# Patient Record
Sex: Female | Born: 1995 | Race: White | Hispanic: No | Marital: Single
Health system: Midwestern US, Community
[De-identification: ages and names within clinical notes are randomized; demographics above are authoritative.]

## PROBLEM LIST (undated history)

## (undated) DIAGNOSIS — F410 Panic disorder [episodic paroxysmal anxiety] without agoraphobia: Secondary | ICD-10-CM

## (undated) DIAGNOSIS — F419 Anxiety disorder, unspecified: Secondary | ICD-10-CM

---
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
## 2016-01-05 ENCOUNTER — Emergency Department: Payer: BLUE CROSS/BLUE SHIELD

## 2016-01-05 ENCOUNTER — Emergency Department
Admission: EM | Admit: 2016-01-05 | Discharge: 2016-01-05 | Disposition: A | Discharge status: Home / Self Care | Payer: BLUE CROSS/BLUE SHIELD | Attending: Emergency Medicine | Admitting: Emergency Medicine

## 2016-01-05 ENCOUNTER — Encounter: Payer: Self-pay | Admitting: *Deleted

## 2016-01-05 DIAGNOSIS — R079 Chest pain, unspecified: Secondary | ICD-10-CM | POA: Diagnosis present

## 2016-01-05 DIAGNOSIS — F410 Panic disorder [episodic paroxysmal anxiety] without agoraphobia: Secondary | ICD-10-CM | POA: Diagnosis not present

## 2016-01-05 MED ORDER — LORAZEPAM 0.5 MG PO TABS: 0.5000 mg | ORAL_TABLET | Freq: Three times a day (TID) | ORAL | Status: AC | PRN

## 2016-01-05 MED ORDER — DIAZEPAM 5 MG PO TABS
5.0000 mg | ORAL_TABLET | Freq: Once | ORAL | Status: DC
  Filled 2016-01-05: qty 1

## 2016-01-05 NOTE — Discharge Instructions (Signed)

## 2016-01-05 NOTE — ED Notes (Signed)
NAD noted at time of D/C. Pt denies questions or concerns. Pt ambulatory to the lobby at this time.  

## 2016-01-05 NOTE — ED Notes (Signed)
Patient transported to X-ray 

## 2016-01-05 NOTE — ED Notes (Signed)
Pt reports chest pain and sob for 6 weeks.  Pt reports starting bcp's 3 months ago.  Reports nausea for 1 week.  Pt tearful and is refusing blood work at this time.  Nonsmoker.  No cough.  No fever.

## 2016-01-05 NOTE — ED Provider Notes (Signed)
[facility] Emergency Department Provider Note     Time seen: ----------------------------------------- 4:10 PM on 01/05/2016 -----------------------------------------    I have reviewed the triage vital signs and the nursing notes.   HISTORY  Chief Complaint Chest Pain    HPI Gina Rivera is a 20 y.o. female who presents to ER with chest pain shortness of breath for last 6 weeks has been intermittent. Patient reports taking birth control 3 months ago. She's been nauseous, patient was tearful and hyperventilating on arrival here. Patient states she felt lightheaded at work and her leg starting hurting her, she then had to sit down at work. Patient is still having chest pain in the midsternal area.   Past Medical History  Diagnosis Date  . Anxiety     There are no active problems to display for this patient.   No past surgical history on file.  Allergies Review of patient's allergies indicates no known allergies.  Social History Social History  Substance Use Topics  . Smoking status: Never Smoker   . Smokeless tobacco: None  . Alcohol Use: No    Review of Systems Constitutional: Negative for fever. Eyes: Negative for visual changes. ENT: Negative for sore throat. Cardiovascular: Positive for chest pain Respiratory: Positive for shortness of breath Gastrointestinal: Negative for abdominal pain, vomiting and diarrhea. Genitourinary: Negative for dysuria. Musculoskeletal: Negative for back pain. Skin: Negative for rash. Neurological: Negative for headaches, positive for weakness  10-point ROS otherwise negative.  ____________________________________________   PHYSICAL EXAM:  VITAL SIGNS: ED Triage Vitals  Enc Vitals Group     BP 01/05/16 1558 145/77 mmHg     Pulse Rate 01/05/16 1558 96     Resp 01/05/16 1558 20     Temp 01/05/16 1558 98.6 F (37 C)     Temp Source 01/05/16 1558 Oral     SpO2 01/05/16 1558 99 %     Weight  01/05/16 1558 175 lb (79.379 kg)     Height 01/05/16 1558  (1.651 m)     Head Cir --      Peak Flow --      Pain Score 01/05/16 1559 4     Pain Loc --      Pain Edu? --      Excl. in GC? --     Constitutional: Alert and oriented. Patient is hyperventilating Eyes: Conjunctivae are normal. PERRL. Normal extraocular movements. ENT   Head: Normocephalic and atraumatic.   Nose: No congestion/rhinnorhea.   Mouth/Throat: Mucous membranes are moist.   Neck: No stridor. Cardiovascular: Rapid rate, regular rhythm. Normal and symmetric distal pulses are present in all extremities. No murmurs, rubs, or gallops. Respiratory: Tachypnea with clear lungs bilaterally. Gastrointestinal: Soft and nontender. No distention. No abdominal bruits.  Musculoskeletal: Nontender with normal range of motion in all extremities. No joint effusions.  No lower extremity tenderness nor edema. Neurologic:  Normal speech and language. No gross focal neurologic deficits are appreciated. Speech is normal. No gait instability. Skin:  Skin is warm, dry and intact. No rash noted. Psychiatric: Anxious mood and affect ____________________________________________  EKG: Interpreted by me. Sinus tachycardia with a rate of 105 bpm, normal PR interval, normal QRS, normal QT interval. Normal axis.  ____________________________________________  ED COURSE:  Pertinent labs & imaging results that were available during my care of the patient were reviewed by me and considered in my medical decision making (see chart for details). Patient is no acute distress, appears to be having a panic  attack. I will offer oral anxiolytics.   IMPRESSION: No active cardiopulmonary disease. ____________________________________________  FINAL ASSESSMENT AND PLAN  Panic attack  Plan: Patient with imaging as dictated above. Patient clinically with a panic attack, EKG and chest x-ray are unremarkable. Will prescribe Ativan she can  take as needed on an outpatient basis.   Emily Filbert, MD   Emily Filbert, MD 01/05/16 [phone]

## 2016-03-28 IMAGING — CR DG CHEST 2V
1 series · 2 of 2 positions shown · non-contrast
Comparison: 12/03/2015

CLINICAL DATA: Chest pain and shortness of breath for 6 weeks.
Nausea for 1 week.

EXAM:
CHEST  2 VIEW

[Series 1: dg chest 2 view · 0.14mm/px · 2 of 2 slices shown]
[im 1/2]
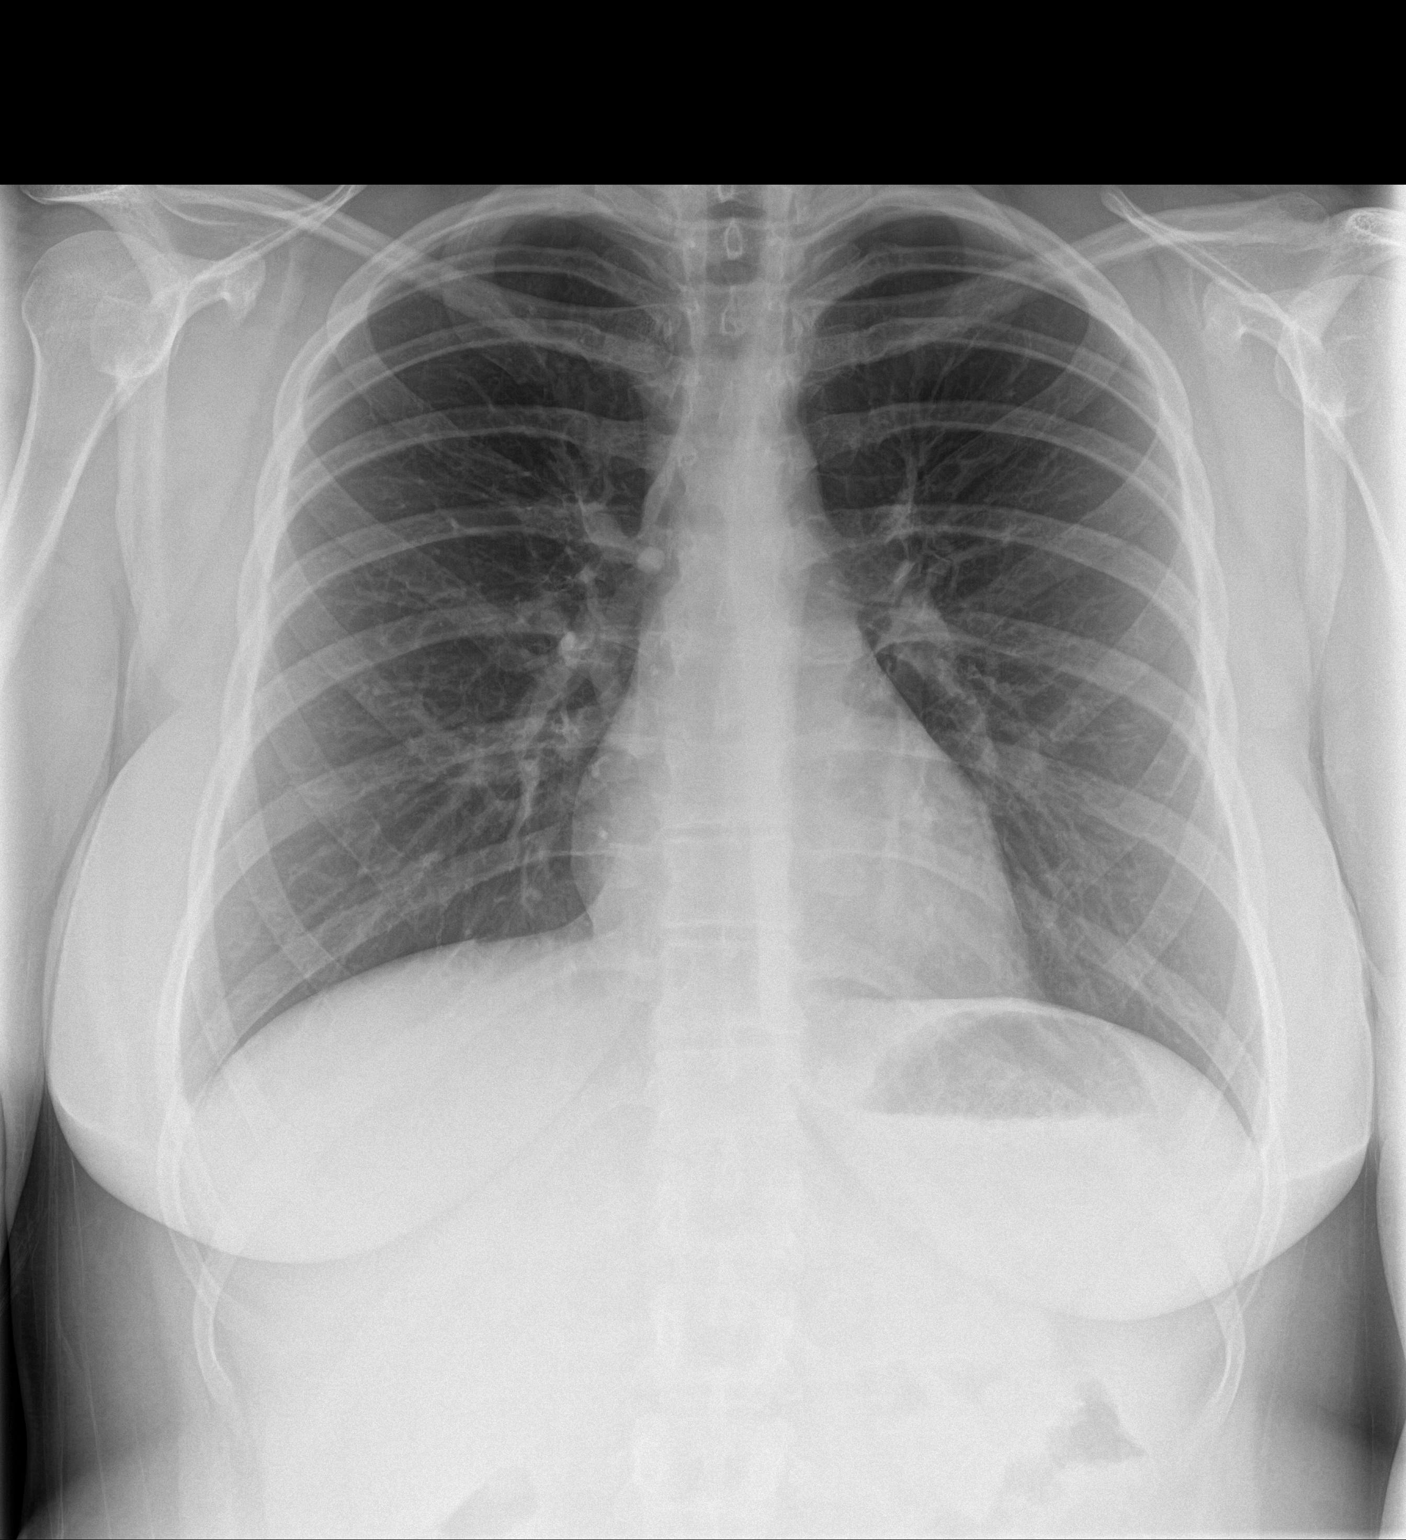
[im 2/2]
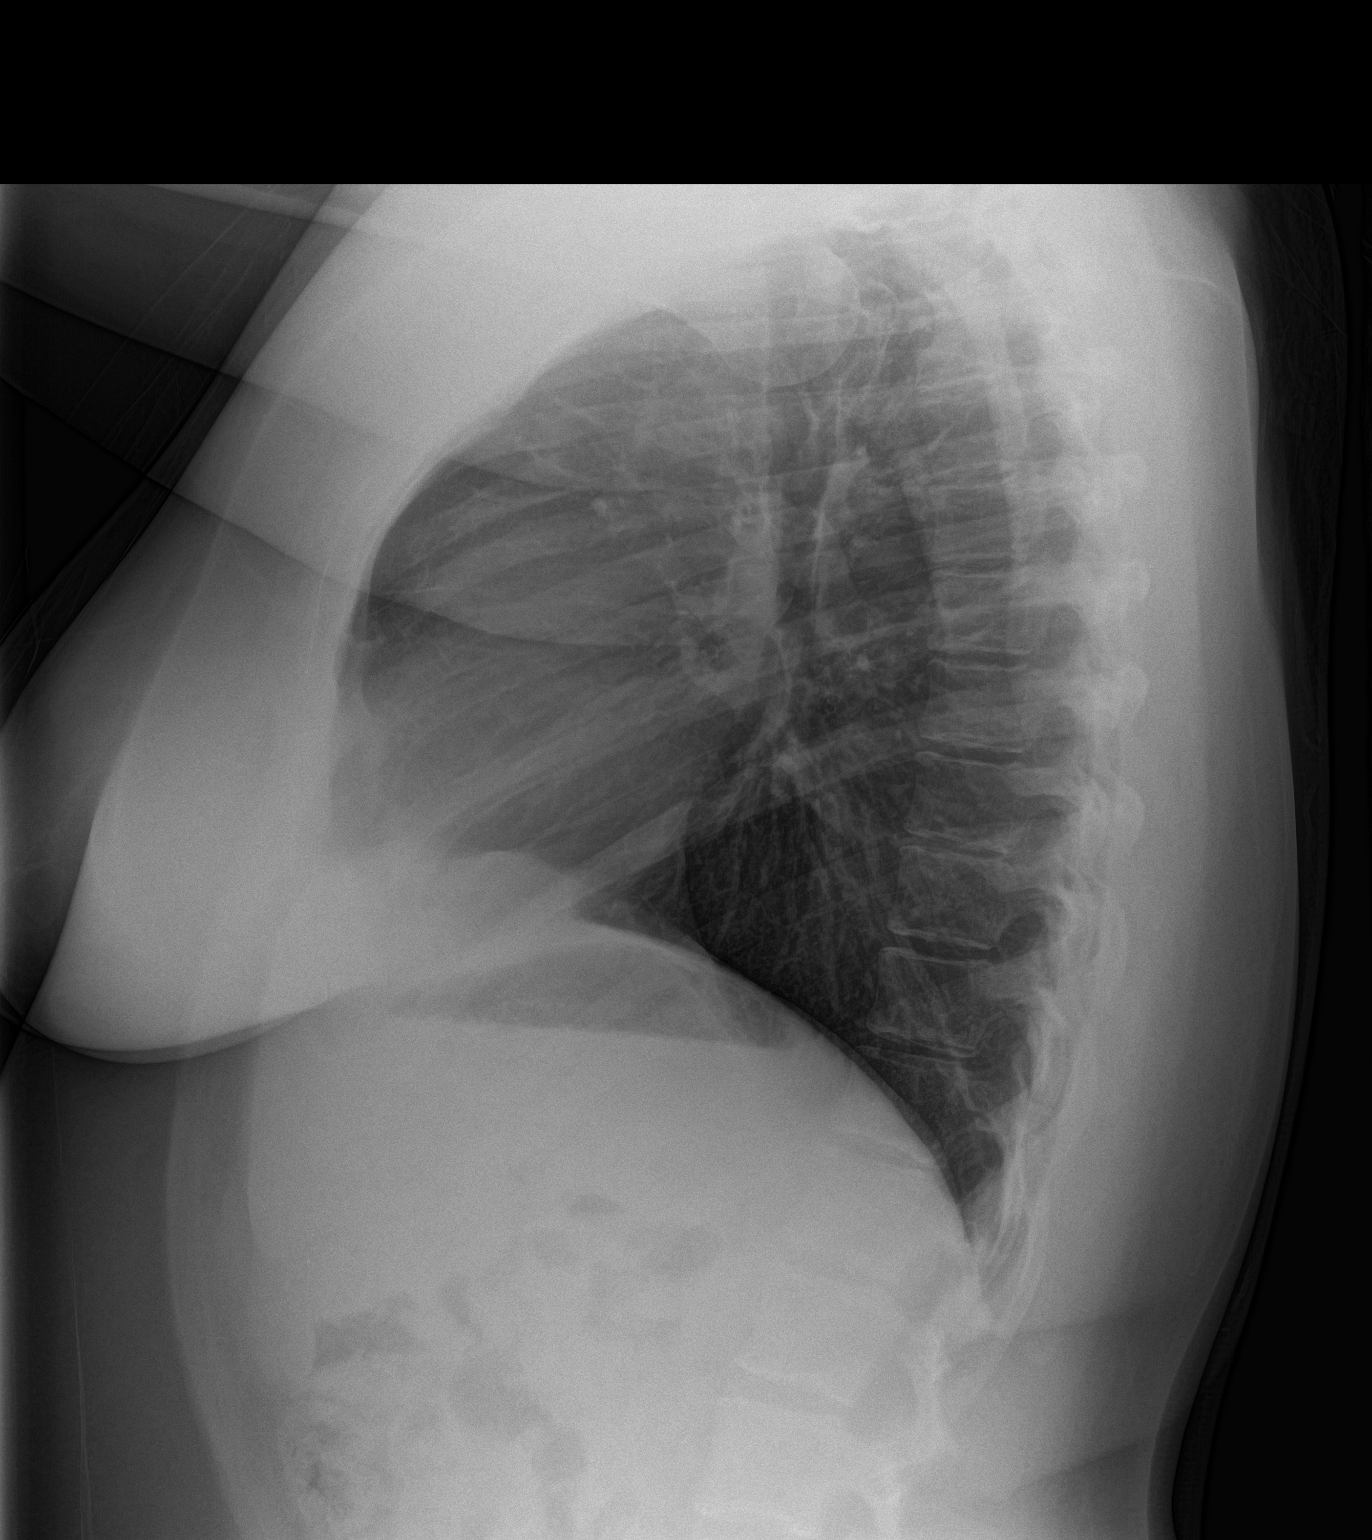

[2 of 2 positions shown; findings below may reference images not displayed]

FINDINGS: The heart size and mediastinal contours are within normal limits.
Both lungs are clear. The visualized skeletal structures are
unremarkable.
IMPRESSION: No active cardiopulmonary disease.

## 2017-11-15 ENCOUNTER — Inpatient Hospital Stay: Admit: 2017-11-15 | Discharge: 2017-11-15 | Disposition: A | Discharge status: Home / Self Care

## 2017-11-15 DIAGNOSIS — R300 Dysuria: Secondary | ICD-10-CM

## 2017-11-15 LAB — UA W/O MICROSCOPIC, REFLEX C & S
Bilirubin Urine: NEGATIVE
Glucose, Urine: NEGATIVE mg/dl
Ketones, Urine: NEGATIVE
Nitrate, UA: NEGATIVE
Protein, UA: 100 mg/dl — AB
Specific Gravity, UA: 1.025 (ref 1.002–1.03)
Urobilinogen, Urine: 0.2 eu/dl (ref 0.0–1.0)
pH, UA: 7.5 (ref 5.0–9.0)

## 2017-11-15 MED ORDER — SULFAMETHOXAZOLE-TRIMETHOPRIM 800-160 MG PO TABS
800-160 MG | ORAL_TABLET | Freq: Two times a day (BID) | ORAL | 0 refills | Status: AC
Start: 2017-11-15 — End: 2017-11-25

## 2017-11-15 MED ORDER — FLUCONAZOLE 150 MG PO TABS
150 MG | ORAL_TABLET | Freq: Once | ORAL | 0 refills | Status: AC
Start: 2017-11-15 — End: 2017-11-15

## 2017-11-15 NOTE — ED Triage Notes (Signed)
To room with c/o hematuria, dysuria, and frequency that started yesterday.

## 2017-11-15 NOTE — ED Provider Notes (Signed)
[facility]  Urgent Care Encounter       CHIEF COMPLAINT       Chief Complaint   Patient presents with   . Dysuria       Nurses Notes reviewed and I agree except as noted in the HPI.  HISTORY OF PRESENT ILLNESS   Nicole Shaffer is a 22 y.o. female who presents     Patient states that since yesterday she has had some intermittent flank pain that started on left side, but has since resolved.  Patient states that she is urinating more frequently, and having burning sensation with urinating.  She reports that she currently takes oral contraceptives, as directed, last known period being that a week ago.        Dysuria    This is a new problem. The current episode started 2 days ago. The problem occurs every urination. The problem has not changed since onset.The quality of the pain is described as burning and aching. The pain is at a severity of 4/10. The pain is mild. There has been no fever. She is not sexually active. Associated symptoms include hesitancy, urgency and flank pain (intermittent, left side). Pertinent negatives include no chills, no sweats, no nausea, no vomiting, no discharge, no frequency, no hematuria and no possible pregnancy. She has tried nothing for the symptoms. Her past medical history is significant for recurrent UTIs. Her past medical history does not include kidney stones, single kidney, urological procedure, urinary stasis or catheterization.       REVIEW OF SYSTEMS     Review of Systems   Constitutional: Negative for chills.   Gastrointestinal: Negative for abdominal pain, nausea and vomiting.   Genitourinary: Positive for dysuria, flank pain (intermittent, left side), hesitancy and urgency. Negative for decreased urine volume, difficulty urinating, dyspareunia, enuresis, frequency, genital sores, hematuria, menstrual problem, pelvic pain, vaginal bleeding, vaginal discharge and vaginal pain.       PAST MEDICAL HISTORY   History reviewed. No pertinent past medical  history.    SURGICALHISTORY     Patient  has no past surgical history on file.    CURRENT MEDICATIONS       Discharge Medication List as of 11/15/2017  1:53 PM      CONTINUE these medications which have NOT CHANGED    Details   norgestimate-ethinyl estradiol (SPRINTEC 28) 0.25-35 MG-MCG per tablet Take 1 tablet by mouth dailyHistorical Med             ALLERGIES     Patient is has No Known Allergies.    Patients   There is no immunization history on file for this patient.    FAMILY HISTORY     Patient's family history is not on file.    SOCIAL HISTORY     Patient  reports that she has never smoked. She has never used smokeless tobacco. She reports that she does not drink alcohol or use drugs.    PHYSICAL EXAM     ED TRIAGE VITALS  BP: 139/77, Temp: 98.7 F (37.1 C), Pulse: 95, Resp: 16, SpO2: 98 %,Estimated body mass index is 33.47 kg/m as calculated from the following:    Height as of this encounter: 5\' 4"  (1.626 m).    Weight as of this encounter: 195 lb (88.5 kg).,Patient's last menstrual period was 11/07/2017.    Physical Exam   Constitutional: She is oriented to person, place, and time. She appears well-developed and well-nourished. No distress.   Musculoskeletal:  Normal range of motion.   Neurological: She is alert and oriented to person, place, and time.   Skin: Skin is warm. Capillary refill takes less than 2 seconds. She is not diaphoretic.   Psychiatric: She has a normal mood and affect. Her behavior is normal. Judgment and thought content normal.   Nursing note and vitals reviewed.      DIAGNOSTIC RESULTS     Labs:  Results for orders placed or performed during the hospital encounter of 11/15/17   UA without Microscopic Reflex C&S   Result Value Ref Range    Glucose, Urine Negative NEGATIVE mg/dl    Bilirubin Urine Negative NEGATIVE    Ketones, Urine Negative NEGATIVE    Specific Gravity, UA 1.025 1.002 - 1.03    Blood, Urine Large (A) NEGATIVE    pH, UA 7.50 5.0 - 9.0    Protein, UA 100 (A) NEGATIVE mg/dl     Urobilinogen, Urine 0.20 0.0 - 1.0 eu/dl    Nitrate, UA Negative NEGATIVE    LEUKOCYTES, UA Small (A) NEGATIVE    Color, UA Amber (A) STRAW-YELL    Character, Urine Slightly Cloudy CLEAR-SL C    REFLEX TO URINE C & S NOT INDICATED        IMAGING:    No orders to display     URGENT CARE COURSE:     Vitals:    11/15/17 1321   BP: 139/77   Pulse: 95   Resp: 16   Temp: 98.7 F (37.1 C)   TempSrc: Temporal   SpO2: 98%   Weight: 195 lb (88.5 kg)   Height: 5\' 4"  (1.626 m)       Medications - No data to display         PROCEDURES:  None    FINAL IMPRESSION      1. Dysuria          DISPOSITION/ PLAN   Patient Is discharged home with prescription for Bactrim and Diflucan.  Discussed with patient that UA results showed blood, as well as small amount of leukocytes.  Patient is instructed to follow-up with primary care provider if symptoms have not improved within the next 4-5 days.  Patient should follow up in ER setting if tenderness or pain to flank increases or does not improve within the next 2-3 days.      PATIENT REFERRED TO:  No primary care provider on file.  No primary physician on file.      DISCHARGE MEDICATIONS:  Discharge Medication List as of 11/15/2017  1:53 PM      START taking these medications    Details   sulfamethoxazole-trimethoprim (BACTRIM DS;SEPTRA DS) 800-160 MG per tablet Take 1 tablet by mouth 2 times daily for 10 days, Disp-20 tablet, R-0Print      fluconazole (DIFLUCAN) 150 MG tablet Take 1 tablet by mouth once for 1 dose, Disp-1 tablet, R-0Print             Discharge Medication List as of 11/15/2017  1:53 PM          Discharge Medication List as of 11/15/2017  1:53 PM          Lindajo Royal, APRN - NP    (Please note that portions of this note were completed with a voice recognition program. Efforts were made to edit the dictations but occasionally words are mis-transcribed.)         Lajean Silvius, APRN - NP  11/15/17 1531

## 2017-11-15 NOTE — Discharge Instructions (Signed)
Patient is encouraged to continue drinking plenty of fluids to help flush out any bacteria from urinary tract.  If symptoms do not improve within the next 3-4 days recommends patient follow up with primary care provider.  If flank tenderness worsens or has not improved within the next 1 to 2 days should follow up in ER setting for possible CT.

## 2020-03-31 ENCOUNTER — Other Ambulatory Visit: Payer: Self-pay

## 2020-03-31 ENCOUNTER — Ambulatory Visit (HOSPITAL_COMMUNITY)
Admission: EM | Admit: 2020-03-31 | Discharge: 2020-03-31 | Disposition: A | Discharge status: Home / Self Care | Payer: BC Managed Care – PPO | Attending: Urgent Care | Admitting: Urgent Care

## 2020-03-31 ENCOUNTER — Encounter (HOSPITAL_COMMUNITY): Payer: Self-pay

## 2020-03-31 DIAGNOSIS — Z20822 Contact with and (suspected) exposure to covid-19: Secondary | ICD-10-CM | POA: Insufficient documentation

## 2020-03-31 DIAGNOSIS — J029 Acute pharyngitis, unspecified: Secondary | ICD-10-CM | POA: Insufficient documentation

## 2020-03-31 DIAGNOSIS — J069 Acute upper respiratory infection, unspecified: Secondary | ICD-10-CM | POA: Diagnosis not present

## 2020-03-31 HISTORY — DX: Panic disorder (episodic paroxysmal anxiety): F41.0

## 2020-03-31 LAB — SARS CORONAVIRUS 2 (TAT 6-24 HRS): SARS Coronavirus 2: NEGATIVE

## 2020-03-31 LAB — POCT RAPID STREP A: Streptococcus, Group A Screen (Direct): NEGATIVE

## 2020-03-31 MED ORDER — IBUPROFEN 600 MG PO TABS: 600.0000 mg | ORAL_TABLET | Freq: Four times a day (QID) | ORAL | 0 refills | Status: AC | PRN

## 2020-03-31 MED ORDER — CEPACOL SORE THROAT 5.4 MG MT LOZG: 1.0000 | LOZENGE | OROMUCOSAL | 0 refills | Status: AC | PRN

## 2020-03-31 MED ORDER — BENZONATATE 100 MG PO CAPS: 100.0000 mg | ORAL_CAPSULE | Freq: Three times a day (TID) | ORAL | 0 refills | Status: AC

## 2020-03-31 NOTE — ED Provider Notes (Signed)
[facility]    CSN: [id number] Arrival date & time: 03/31/20  1032      History   Chief Complaint Chief Complaint  Patient presents with  . Sore Throat    HPI Gina Rivera is a 24 y.o. female.   Patient with history of anxiety and panic attacks presents for evaluation of sore throat.  She reports sore throat is been very bad for the last 2 days.  She reports it hurts to swallow both liquids and solids.  She denies difficulty swallowing and that is only the pain causing the issue.  Sensation of throat swelling.  She has been tolerating liquids and oral medicines however.  There is been no measured fever up to 100.4 at home.  She has been taking DayQuil and NyQuil.  This is helped only minorly.  She is also had some nasal congestion and a slight cough.  Her family members all had a recent virus.  Known was tested for Covid.  Symptoms were similar to this.  She has not had any other exposures or known strep exposures.  Denies upset stomach, nausea, vomiting, diarrhea.  No change in taste or smell.  No ear pain.  She does report she felt a little lightheaded last couple days in the morning.  Denies dizziness.  No chest pain or shortness of breath.     Past Medical History:  Diagnosis Date  . Anxiety   . Panic attacks     There are no problems to display for this patient.   History reviewed. No pertinent surgical history.  OB History    Gravida  0   Para  0   Term  0   Preterm  0   AB  0   Living  0     SAB  0   TAB  0   Ectopic  0   Multiple  0   Live Births               Home Medications    Prior to Admission medications   Medication Sig Start Date End Date Taking? Authorizing Provider  benzonatate (TESSALON) 100 MG capsule Take 1 capsule (100 mg total) by mouth every 8 (eight) hours. 03/31/20   Adolpho Meenach, Marguerita Beards, PA-C  ibuprofen (ADVIL) 600 MG tablet Take 1 tablet (600 mg total) by mouth every 6 (six) hours as needed. 03/31/20   Maiyah Goyne, Marguerita Beards,  PA-C  Menthol (CEPACOL SORE THROAT) 5.4 MG LOZG Use as directed 1 lozenge (5.4 mg total) in the mouth or throat every 2 (two) hours as needed. 03/31/20   Laurelin Elson, Marguerita Beards, PA-C  PRESCRIPTION MEDICATION Take 1 tablet by mouth daily. Birth Control    [provider]  SPRINTEC 28 0.25-35 MG-MCG tablet Take 1 tablet by mouth daily. 03/12/20   [provider]    Family History Family History  Problem Relation Age of Onset  . Healthy Mother   . Healthy Father     Social History Social History   Tobacco Use  . Smoking status: Never Smoker  Substance Use Topics  . Alcohol use: No  . Drug use: No     Allergies   Patient has no known allergies.   Review of Systems Review of Systems   Physical Exam Triage Vital Signs ED Triage Vitals  Enc Vitals Group     BP 03/31/20 1055 136/76     Pulse Rate 03/31/20 1055 (!) 106     Resp 03/31/20 1055 18  Temp 03/31/20 1055 99.1 F (37.3 C)     Temp Source 03/31/20 1055 Oral     SpO2 03/31/20 1055 99 %     Weight 03/31/20 1056 240 lb (108.9 kg)     Height 03/31/20 1056 5\' 3"  (1.6 m)     Head Circumference --      Peak Flow --      Pain Score 03/31/20 1055 8     Pain Loc --      Pain Edu? --      Excl. in GC? --    No data found.  Updated Vital Signs BP 136/76   Pulse (!) 106   Temp 99.1 F (37.3 C) (Oral)   Resp 18   Ht 5\' 3"  (1.6 m)   Wt 240 lb (108.9 kg)   SpO2 99%   BMI 42.51 kg/m   Visual Acuity Right Eye Distance:   Left Eye Distance:   Bilateral Distance:    Right Eye Near:   Left Eye Near:    Bilateral Near:     Physical Exam Vitals and nursing note reviewed.  Constitutional:      General: She is not in acute distress.    Appearance: She is well-developed. She is not ill-appearing.  HENT:     Head: Normocephalic and atraumatic.     Right Ear: Tympanic membrane and ear canal normal.     Left Ear: Tympanic membrane and ear canal normal.     Nose: Congestion and rhinorrhea present.      Mouth/Throat:     Mouth: Mucous membranes are moist.     Pharynx: Oropharynx is clear. Uvula midline. Posterior oropharyngeal erythema (mild) present. No pharyngeal swelling, oropharyngeal exudate or uvula swelling.     Tonsils: No tonsillar exudate or tonsillar abscesses. 1+ on the right. 1+ on the left.  Eyes:     Conjunctiva/sclera: Conjunctivae normal.  Cardiovascular:     Rate and Rhythm: Normal rate and regular rhythm.     Heart sounds: No murmur.  Pulmonary:     Effort: Pulmonary effort is normal. No respiratory distress.     Breath sounds: Normal breath sounds. No wheezing, rhonchi or rales.  Abdominal:     Palpations: Abdomen is soft.     Tenderness: There is no abdominal tenderness.  Musculoskeletal:     Cervical back: Normal range of motion and neck supple.  Lymphadenopathy:     Cervical: No cervical adenopathy.  Skin:    General: Skin is warm and dry.  Neurological:     Mental Status: She is alert.      UC Treatments / Results  Labs (all labs ordered are listed, but only abnormal results are displayed) Labs Reviewed  SARS CORONAVIRUS 2 (TAT 6-24 HRS)  CULTURE, GROUP A STREP [facility])  POCT RAPID STREP A    EKG   Radiology No results found.  Procedures Procedures (including critical care time)  Medications Ordered in UC Medications - No data to display  Initial Impression / Assessment and Plan / UC Course  I have reviewed the triage vital signs and the nursing notes.  Pertinent labs & imaging results that were available during my care of the patient were reviewed by me and considered in my medical decision making (see chart for details).     #Viral pharyngitis Patient is 24 year old female with symptoms consistent with a viral pharyngitis.  Rapid strep negative.  Minimal Centor criteria, will defer antibiotic therapy until culture results.  She is afebrile  with a relatively benign exam and with the exception of slight tachycardia a normal set of vitals.   Believe tachycardia could be related to baseline anxiety.  She is controlling her airway well and her secretions well.  No evidence of tonsillar abscess or tonsillar cellulitis.  Highly suspect virus at this point.  We will treat symptomatically.  Covid PCR sent.  Return and emergency department precautions were discussed.  Patient and mother verbalized understanding plan. Final Clinical Impressions(s) / UC Diagnoses   Final diagnoses:  Viral pharyngitis     Discharge Instructions     I believe this is a virus.  This should respond to symptomatic treatment.  We have also sent a throat culture and will notify you of any necessary change your treatment.  Take the ibuprofen every 6 hours for your throat pain May also continue the DayQuil as directed on the label Use the Cepacol lozenges every 2 hours for throat pain Drink soothing liquids and Ensure you are drinking plenty of water.  Food is not as important at this time but eat small amounts of soft foods as tolerated -You may also consider drinking Pedialyte to ensure you are keeping enough electrolytes and.  If you are not improving over the next 2 to 3 days please return for reevaluation.  If you feel like you can no longer tolerate liquids due to pain or feel like your throat is swelling please return or go to the emergency department for evaluation.  If your Covid-19 test is positive, you will receive a phone call from [facility] regarding your results. Negative test results are not called. Both positive and negative results area always visible on MyChart. If you do not have a MyChart account, sign up instructions are in your discharge papers.   Persons who are directed to care for themselves at home may discontinue isolation under the following conditions:  . At least 10 days have passed since symptom onset and . At least 24 hours have passed without running a fever (this means without the use of fever-reducing medications)  and . Other symptoms have improved.  Persons infected with COVID-19 who never develop symptoms may discontinue isolation and other precautions 10 days after the date of their first positive COVID-19 test.       ED Prescriptions    Medication Sig Dispense Auth. Provider   ibuprofen (ADVIL) 600 MG tablet Take 1 tablet (600 mg total) by mouth every 6 (six) hours as needed. 30 tablet Mousa Prout, Veryl Speak, PA-C   Menthol (CEPACOL SORE THROAT) 5.4 MG LOZG Use as directed 1 lozenge (5.4 mg total) in the mouth or throat every 2 (two) hours as needed. 30 lozenge Zaion Hreha, Veryl Speak, PA-C   benzonatate (TESSALON) 100 MG capsule Take 1 capsule (100 mg total) by mouth every 8 (eight) hours. 21 capsule Laniece Hornbaker, Veryl Speak, PA-C     PDMP not reviewed this encounter.   Hermelinda Medicus, PA-C 03/31/20 1132

## 2020-03-31 NOTE — Discharge Instructions (Signed)
I believe this is a virus.  This should respond to symptomatic treatment.  We have also sent a throat culture and will notify you of any necessary change your treatment.  Take the ibuprofen every 6 hours for your throat pain May also continue the DayQuil as directed on the label Use the Cepacol lozenges every 2 hours for throat pain Drink soothing liquids and Ensure you are drinking plenty of water.  Food is not as important at this time but eat small amounts of soft foods as tolerated -You may also consider drinking Pedialyte to ensure you are keeping enough electrolytes and.  If you are not improving over the next 2 to 3 days please return for reevaluation.  If you feel like you can no longer tolerate liquids due to pain or feel like your throat is swelling please return or go to the emergency department for evaluation.  If your Covid-19 test is positive, you will receive a phone call from [facility] regarding your results. Negative test results are not called. Both positive and negative results area always visible on MyChart. If you do not have a MyChart account, sign up instructions are in your discharge papers.   Persons who are directed to care for themselves at home may discontinue isolation under the following conditions:   At least 10 days have passed since symptom onset and  At least 24 hours have passed without running a fever (this means without the use of fever-reducing medications) and  Other symptoms have improved.  Persons infected with COVID-19 who never develop symptoms may discontinue isolation and other precautions 10 days after the date of their first positive COVID-19 test.

## 2020-03-31 NOTE — ED Triage Notes (Signed)
Pt c/o sore throat, dysphagia, chills, non productive cough, dizzinessx2 days. Pt hasn't been able to eat or drink and can't talk due to the throat pain.

## 2020-04-02 LAB — CULTURE, GROUP A STREP (THRC)

## 2020-04-30 ENCOUNTER — Other Ambulatory Visit: Payer: Self-pay

## 2020-04-30 ENCOUNTER — Encounter (HOSPITAL_COMMUNITY): Payer: Self-pay

## 2020-04-30 ENCOUNTER — Ambulatory Visit (HOSPITAL_COMMUNITY)
Admission: EM | Admit: 2020-04-30 | Discharge: 2020-04-30 | Disposition: A | Discharge status: Home / Self Care | Payer: BC Managed Care – PPO | Attending: Physician Assistant | Admitting: Physician Assistant

## 2020-04-30 DIAGNOSIS — S61217A Laceration without foreign body of left little finger without damage to nail, initial encounter: Secondary | ICD-10-CM | POA: Diagnosis not present

## 2020-04-30 NOTE — ED Triage Notes (Signed)
Pt presents today after cutting pinky finger on razor while cutting tape. RN differerd assessment at this time, r/t pt holding pressure with dressing. Pt states lac is on palmar side of pinky close to 3rd joint. Pt denies OTC treatment. Pt states lac is painful.

## 2020-04-30 NOTE — Discharge Instructions (Addendum)
Keep the bandage on for the next 24-36 hours Wear gloves at work and keep dry. Do not allow direct water onto the wound for 3-4 days  Do not remove steristrips, if they fall off, replace.  You may apply topical antiobiotic ointment  You have decline tetanus update today, I recommend this with your PCP  If you notice swelling, redness, discharge, fever or chills return

## 2020-04-30 NOTE — ED Provider Notes (Addendum)
[facility]    CSN: [id number] Arrival date & time: 04/30/20  1753      History   Chief Complaint Chief Complaint  Patient presents with  . Extremity Laceration    HPI Gina Rivera is a 24 y.o. female.   Patient presents for evaluation of laceration to left hand.  She reports earlier today she was using a razor to cut something for work when she cut the palm side of the base of her little finger on the left hand.  She reports it bled quite a bit was able to stop this with direct pressure and a bandage.  Patient is adamant that she is afraid of needles and that she would prefer any method other than sutures today.  She is unsure when her last tetanus shot was.        Past Medical History:  Diagnosis Date  . Anxiety   . Panic attacks     There are no problems to display for this patient.   History reviewed. No pertinent surgical history.  OB History    Gravida  0   Para  0   Term  0   Preterm  0   AB  0   Living  0     SAB  0   TAB  0   Ectopic  0   Multiple  0   Live Births               Home Medications    Prior to Admission medications   Medication Sig Start Date End Date Taking? Authorizing Provider  ibuprofen (ADVIL) 600 MG tablet Take 1 tablet (600 mg total) by mouth every 6 (six) hours as needed. 03/31/20  Yes Eden Rho, Veryl Speak, PA-C  SPRINTEC 28 0.25-35 MG-MCG tablet Take 1 tablet by mouth daily. 03/12/20  Yes [provider]  benzonatate (TESSALON) 100 MG capsule Take 1 capsule (100 mg total) by mouth every 8 (eight) hours. 03/31/20   Candon Caras, Veryl Speak, PA-C  Menthol (CEPACOL SORE THROAT) 5.4 MG LOZG Use as directed 1 lozenge (5.4 mg total) in the mouth or throat every 2 (two) hours as needed. 03/31/20   Katalea Ucci, Veryl Speak, PA-C  PRESCRIPTION MEDICATION Take 1 tablet by mouth daily. Birth Control    [provider]    Family History Family History  Problem Relation Age of Onset  . Healthy Mother   . Healthy Father       Social History Social History   Tobacco Use  . Smoking status: Never Smoker  . Smokeless tobacco: Never Used  Substance Use Topics  . Alcohol use: No  . Drug use: No     Allergies   Patient has no known allergies.   Review of Systems Review of Systems   Physical Exam Triage Vital Signs ED Triage Vitals  Enc Vitals Group     BP 04/30/20 1824 123/67     Pulse Rate 04/30/20 1824 91     Resp --      Temp 04/30/20 1824 98.2 F (36.8 C)     Temp Source 04/30/20 1824 Oral     SpO2 04/30/20 1824 100 %     Weight --      Height --      Head Circumference --      Peak Flow --      Pain Score 04/30/20 1827 2     Pain Loc --      Pain Edu? --  Excl. in GC? --    No data found.  Updated Vital Signs BP 123/67 (BP Location: Right Arm)   Pulse 91   Temp 98.2 F (36.8 C) (Oral)   LMP 04/09/2020 (Exact Date)   SpO2 100%   Visual Acuity Right Eye Distance:   Left Eye Distance:   Bilateral Distance:    Right Eye Near:   Left Eye Near:    Bilateral Near:     Physical Exam Vitals and nursing note reviewed.  Constitutional:      Appearance: Normal appearance.  HENT:     Head: Normocephalic and atraumatic.  Musculoskeletal:     Comments: There is a approximately 1 cm in length horizontal laceration of the palmar aspect of the base of the left fifth digit.  3 mm in depth.  There is some subcutaneous fat tissue showing.  Patient has full range of motion of the left fifth digit.  Good strength with resisted flexion.  Cap refill less than 2 seconds.  Sensation intact.  Neurological:     Mental Status: She is alert.      UC Treatments / Results  Labs (all labs ordered are listed, but only abnormal results are displayed) Labs Reviewed - No data to display  EKG   Radiology No results found.  Procedures Laceration Repair  Date/Time: 04/30/2020 8:43 PM Performed by: Purnell Shoemaker, PA-C Authorized by: Purnell Shoemaker, PA-C   Consent:    Consent  obtained:  Verbal   Consent given by:  Patient   Risks discussed:  Poor cosmetic result and need for additional repair   Alternatives discussed:  Delayed treatment Anesthesia (see MAR for exact dosages):    Anesthesia method:  None Laceration details:    Location:  Finger   Finger location:  L small finger   Length (cm):  1   Depth (mm):  3 Repair type:    Repair type:  Simple Treatment:    Area cleansed with:  Soap and water   Amount of cleaning:  Standard   Irrigation solution:  Sterile saline   Irrigation volume:  20cc   Irrigation method:  Syringe Skin repair:    Repair method:  Steri-Strips   Number of Steri-Strips:  2 Post-procedure details:    Dressing:  Antibiotic ointment and bulky dressing   Patient tolerance of procedure:  Tolerated well, no immediate complications    Small amount of subcutaneous tissue was reinserted under the skin prior to closing with Steri-Strips.    (including critical care time)  Medications Ordered in UC Medications - No data to display  Initial Impression / Assessment and Plan / UC Course  I have reviewed the triage vital signs and the nursing notes.  Pertinent labs & imaging results that were available during my care of the patient were reviewed by me and considered in my medical decision making (see chart for details).     #Laceration of the little finger Patient is a 24 year old with laceration of little finger.  I offered suturing to the patient as I felt this would offer the best cosmetic and facet healing outcome, however she would strongly like any other option.  Discussed that the wound would heal with use of Dermabond or Steri-Strips had to choose between these and I would choose Steri-Strips.  I do feel that this would be an acceptable way to proceed.  I offered tetanus update however patient refuses.  Discussed the risks of tetanus and the signs of tetanus.  Patient  placed in a slightly bulky bandage and discussed with her to  keep in slight flexion to take tension off of the wound.  Wound care was discussed.  Signs of infection discussed.  Patient verbalized understanding of plan of care. Final Clinical Impressions(s) / UC Diagnoses   Final diagnoses:  Laceration of left little finger without foreign body without damage to nail, initial encounter     Discharge Instructions     Keep the bandage on for the next 24-36 hours Wear gloves at work and keep dry. Do not allow direct water onto the wound for 3-4 days  Do not remove steristrips, if they fall off, replace.  You may apply topical antiobiotic ointment  You have decline tetanus update today, I recommend this with your PCP  If you notice swelling, redness, discharge, fever or chills return      ED Prescriptions    None     PDMP not reviewed this encounter.   Hermelinda Medicus, PA-C 05/01/20 0051    Aracelys Glade, Veryl Speak, PA-C 05/01/20 [id number]

## 2024-07-20 ENCOUNTER — Other Ambulatory Visit: Payer: Self-pay

## 2024-07-20 ENCOUNTER — Encounter (HOSPITAL_BASED_OUTPATIENT_CLINIC_OR_DEPARTMENT_OTHER): Payer: Self-pay

## 2024-07-20 ENCOUNTER — Emergency Department (HOSPITAL_BASED_OUTPATIENT_CLINIC_OR_DEPARTMENT_OTHER): Payer: Self-pay | Admitting: Radiology

## 2024-07-20 DIAGNOSIS — S99922A Unspecified injury of left foot, initial encounter: Secondary | ICD-10-CM | POA: Diagnosis present

## 2024-07-20 DIAGNOSIS — S92355A Nondisplaced fracture of fifth metatarsal bone, left foot, initial encounter for closed fracture: Secondary | ICD-10-CM | POA: Diagnosis not present

## 2024-07-20 DIAGNOSIS — W08XXXA Fall from other furniture, initial encounter: Secondary | ICD-10-CM | POA: Diagnosis not present

## 2024-07-20 NOTE — ED Triage Notes (Signed)
 Patient arrives with family after falling off a step-stool, injuring the lateral side of her left foot at 1749 today. Patient states it was a 2-step stool; denies hitting head or passing out. Patient states she did not take any pain medication or ice it; patient states pain increases when she moves her left foot. Mild swelling noted to left foot.

## 2024-07-21 ENCOUNTER — Telehealth (HOSPITAL_BASED_OUTPATIENT_CLINIC_OR_DEPARTMENT_OTHER): Payer: Self-pay | Admitting: Physician Assistant

## 2024-07-21 ENCOUNTER — Emergency Department (HOSPITAL_BASED_OUTPATIENT_CLINIC_OR_DEPARTMENT_OTHER)
Admission: EM | Admit: 2024-07-21 | Discharge: 2024-07-21 | Disposition: A | Discharge status: Home / Self Care | Payer: Self-pay | Attending: Emergency Medicine | Admitting: Emergency Medicine

## 2024-07-21 DIAGNOSIS — S92355A Nondisplaced fracture of fifth metatarsal bone, left foot, initial encounter for closed fracture: Secondary | ICD-10-CM

## 2024-07-21 MED ORDER — HYDROCODONE-ACETAMINOPHEN 5-325 MG PO TABS: 1.0000 | ORAL_TABLET | Freq: Four times a day (QID) | ORAL | 0 refills | Status: DC | PRN

## 2024-07-21 MED ORDER — HYDROCODONE-ACETAMINOPHEN 5-325 MG PO TABS: 2.0000 | ORAL_TABLET | ORAL | 0 refills | Status: DC | PRN

## 2024-07-21 MED ORDER — HYDROCODONE-ACETAMINOPHEN 5-325 MG PO TABS
1.0000 | ORAL_TABLET | Freq: Once | ORAL | Status: AC
  Administered 2024-07-21: 1 via ORAL
  Filled 2024-07-21: qty 1

## 2024-07-21 MED ORDER — HYDROCODONE-ACETAMINOPHEN 5-325 MG PO TABS: 1.0000 | ORAL_TABLET | ORAL | 0 refills | Status: AC | PRN

## 2024-07-21 NOTE — ED Provider Notes (Signed)
 [place] EMERGENCY DEPARTMENT AT [facility] Provider Note   CSN: [id number] Arrival date & time: 07/20/24  1902     Patient presents with: Foot Injury   Gina Rivera is a 28 y.o. female.   Patient states she fell off of a small stool around 6 PM injuring her left foot.  Denies hitting head or losing consciousness.  Complains of pain to left foot only.  No neck, back, chest or abdominal pain.  Pain is to left lateral foot that radiates up the leg.  No numbness or tingling.  Did not take any medication for it at home.  No proximal hip or knee pain.  The history is provided by the patient.  Foot Injury Associated symptoms: no fever        Prior to Admission medications   Medication Sig Start Date End Date Taking? Authorizing Provider  benzonatate  (TESSALON ) 100 MG capsule Take 1 capsule (100 mg total) by mouth every 8 (eight) hours. 03/31/20   Darr, Jacob, PA-C  ibuprofen  (ADVIL ) 600 MG tablet Take 1 tablet (600 mg total) by mouth every 6 (six) hours as needed. 03/31/20   Darr, Jacob, PA-C  Menthol (CEPACOL SORE THROAT) 5.4 MG LOZG Use as directed 1 lozenge (5.4 mg total) in the mouth or throat every 2 (two) hours as needed. 03/31/20   Darr, Lang, PA-C  PRESCRIPTION MEDICATION Take 1 tablet by mouth daily. Birth Control    [provider]  SPRINTEC 28 0.25-35 MG-MCG tablet Take 1 tablet by mouth daily. 03/12/20   [provider]    Allergies: Patient has no known allergies.    Review of Systems  Constitutional:  Negative for activity change, appetite change and fever.  HENT:  Negative for congestion and rhinorrhea.   Respiratory:  Negative for chest tightness and shortness of breath.   Cardiovascular:  Negative for chest pain.  Gastrointestinal:  Negative for abdominal pain and vomiting.  Genitourinary:  Negative for dysuria and hematuria.  Musculoskeletal:  Positive for arthralgias and myalgias.  Skin:  Negative for rash.  Neurological:  Negative for  dizziness, weakness and headaches.   all other systems are negative except as noted in the HPI and PMH.    Updated Vital Signs BP (!) 134/95 (BP Location: Left Arm)   Pulse 69   Temp 98.3 F (36.8 C) (Oral)   Resp 17   Ht 5' 3 (1.6 m)   Wt 100.5 kg   SpO2 100%   BMI 39.24 kg/m   Physical Exam Vitals and nursing note reviewed.  Constitutional:      General: She is not in acute distress.    Appearance: She is well-developed.  HENT:     Head: Normocephalic and atraumatic.     Mouth/Throat:     Pharynx: No oropharyngeal exudate.  Eyes:     Conjunctiva/sclera: Conjunctivae normal.     Pupils: Pupils are equal, round, and reactive to light.  Neck:     Comments: No meningismus. Cardiovascular:     Rate and Rhythm: Normal rate and regular rhythm.     Heart sounds: Normal heart sounds. No murmur heard. Pulmonary:     Effort: Pulmonary effort is normal. No respiratory distress.     Breath sounds: Normal breath sounds.  Abdominal:     Palpations: Abdomen is soft.     Tenderness: There is no abdominal tenderness. There is no guarding or rebound.  Musculoskeletal:        General: Swelling, tenderness and signs of  injury present.     Cervical back: Normal range of motion and neck supple.     Comments: Tenderness and ecchymosis to left lateral foot without deformity.  Full range of motion of left hip and knee.  No proximal fibular pain. Intact DP and PT pulse.  Compartments are soft. Able to flex and extend toes at ankle.  Skin:    General: Skin is warm.  Neurological:     Mental Status: She is alert and oriented to person, place, and time.     Cranial Nerves: No cranial nerve deficit.     Motor: No abnormal muscle tone.     Coordination: Coordination normal.     Comments:  5/5 strength throughout. CN 2-12 intact.Equal grip strength.   Psychiatric:        Behavior: Behavior normal.     (all labs ordered are listed, but only abnormal results are displayed) Labs Reviewed   PREGNANCY, URINE    EKG: None  Radiology: DG Ankle Complete Left Result Date: 07/20/2024 CLINICAL DATA:  Fall EXAM: LEFT ANKLE COMPLETE - 3+ VIEW COMPARISON:  Left foot x-ray same day FINDINGS: There is nondisplaced fracture through the proximal aspect of the fifth metatarsal. No other ankle fractures are seen. Joint spaces are maintained. There is lateral soft tissue swelling. IMPRESSION: Nondisplaced fracture through the proximal aspect of the fifth metatarsal. Electronically Signed   By: Greig Pique M.D.   On: 07/20/2024 23:35   DG Foot Complete Left Result Date: 07/20/2024 CLINICAL DATA:  Left foot injury, lateral pain, bruising EXAM: LEFT FOOT - COMPLETE 3+ VIEW COMPARISON:  None Available. FINDINGS: Nondisplaced fracture noted through the proximal shaft of the left 5th metatarsal. Overlying soft tissue swelling. No subluxation or dislocation. Joint spaces maintained. IMPRESSION: Nondisplaced fracture through the proximal shaft of the left 5th metatarsal. Electronically Signed   By: Franky Crease M.D.   On: 07/20/2024 21:03     Procedures   Medications Ordered in the ED  HYDROcodone -acetaminophen  (NORCO/VICODIN) 5-325 MG per tablet 1 tablet (has no administration in time range)                                    Medical Decision Making Amount and/or Complexity of Data Reviewed Labs: ordered. Decision-making details documented in ED Course. Radiology: ordered and independent interpretation performed. Decision-making details documented in ED Course. ECG/medicine tests: ordered and independent interpretation performed. Decision-making details documented in ED Course.  Risk Prescription drug management.   Fall from stool with left foot injury.  No head injury.  No neck or back pain.  Vitals are stable.  GCS is 15.  ABCs are intact.  X-ray remarkable for proximal fifth metatarsal fracture without displacement.  Results reviewed and interpreted by me.  Patient will be splinted  and given crutches, nonweightbearing, orthopedic follow-up. Ice, elevation, pain control, immobilization Follow-up with orthopedics.  Return precautions discussed.     Final diagnoses:  Closed nondisplaced fracture of fifth metatarsal bone of left foot, initial encounter    ED Discharge Orders     None          Mariadelcarmen Corella, Garnette, MD 07/21/24 [phone]

## 2024-07-21 NOTE — Discharge Instructions (Addendum)
 Keep leg elevated, take the pain medication as prescribed and follow-up with the orthopedic doctor.  Do not bear weight on your left leg until cleared by the orthopedic doctor.  Return to the ED with new or worsening symptoms.

## 2024-07-21 NOTE — Telephone Encounter (Cosign Needed Addendum)
 [address] [facility] states they did not receive Hydrocodone  Rx. This was re-sent for the patient.   7:40 - called [address] to verify they received the Rx and was told to send to [facility] because they close soon. Rx again sent to [facility].
